# Patient Record
Sex: Female | Born: 1975 | Race: White | Hispanic: No | Marital: Married | Smoking: Never smoker
Health system: Southern US, Community
[De-identification: ages and names within clinical notes are randomized; demographics above are authoritative.]

## PROBLEM LIST (undated history)

## (undated) HISTORY — PX: CHOLECYSTECTOMY: SHX55

## (undated) HISTORY — PX: TONSILLECTOMY: SUR1361

## (undated) HISTORY — PX: NASAL SINUS SURGERY: SHX719

---

## 2011-02-23 ENCOUNTER — Emergency Department (INDEPENDENT_AMBULATORY_CARE_PROVIDER_SITE_OTHER): Payer: BC Managed Care – PPO

## 2011-02-23 ENCOUNTER — Emergency Department (HOSPITAL_BASED_OUTPATIENT_CLINIC_OR_DEPARTMENT_OTHER)
Admission: EM | Admit: 2011-02-23 | Discharge: 2011-02-23 | Disposition: A | Discharge status: Home / Self Care | Payer: BC Managed Care – PPO | Attending: Emergency Medicine | Admitting: Emergency Medicine

## 2011-02-23 DIAGNOSIS — S80812A Abrasion, left lower leg, initial encounter: Secondary | ICD-10-CM

## 2011-02-23 DIAGNOSIS — S8012XA Contusion of left lower leg, initial encounter: Secondary | ICD-10-CM

## 2011-02-23 DIAGNOSIS — M79609 Pain in unspecified limb: Secondary | ICD-10-CM

## 2011-02-23 DIAGNOSIS — S8010XA Contusion of unspecified lower leg, initial encounter: Secondary | ICD-10-CM | POA: Insufficient documentation

## 2011-02-23 DIAGNOSIS — Z79899 Other long term (current) drug therapy: Secondary | ICD-10-CM | POA: Insufficient documentation

## 2011-02-23 DIAGNOSIS — W1789XA Other fall from one level to another, initial encounter: Secondary | ICD-10-CM | POA: Insufficient documentation

## 2011-02-23 DIAGNOSIS — Y92009 Unspecified place in unspecified non-institutional (private) residence as the place of occurrence of the external cause: Secondary | ICD-10-CM | POA: Insufficient documentation

## 2011-02-23 DIAGNOSIS — X58XXXA Exposure to other specified factors, initial encounter: Secondary | ICD-10-CM

## 2011-02-23 DIAGNOSIS — IMO0002 Reserved for concepts with insufficient information to code with codable children: Secondary | ICD-10-CM | POA: Insufficient documentation

## 2011-02-23 MED ORDER — HYDROCODONE-ACETAMINOPHEN 5-325 MG PO TABS: 1.0000 | ORAL_TABLET | Freq: Four times a day (QID) | ORAL | Status: AC | PRN

## 2011-02-23 MED ORDER — IBUPROFEN 800 MG PO TABS: 800.0000 mg | ORAL_TABLET | Freq: Three times a day (TID) | ORAL | Status: AC | PRN

## 2011-02-23 NOTE — ED Provider Notes (Signed)
History     CSN: [id number] Arrival date & time: 02/23/2011  9:16 PM   First MD Initiated Contact with Patient 02/23/11 2146      Chief Complaint  Patient presents with  . Leg Injury    (Consider location/radiation/quality/duration/timing/severity/associated sxs/prior treatment) HPI Patient states, that she was working in her attic when she stepped off the floor and in the attic and stepped through the ceiling.  She states that she has an abrasion to her left lower leg.  She denies any other areas of trauma .she states that her tetanus shot is up-to-date .patient also noted bruising around the area. History reviewed. No pertinent past medical history.  Past Surgical History  Procedure Date  . Cholecystectomy   . Tonsillectomy   . Cesarean section     No family history on file.  History  Substance Use Topics  . Smoking status: Never Smoker   . Smokeless tobacco: Not on file  . Alcohol Use: No    OB History    Grav Para Term Preterm Abortions TAB SAB Ect Mult Living                  Review of Systems Pertinent positive and negatives reviewed in the history of present illness Allergies  Codeine and Percocet  Home Medications   Current Outpatient Rx  Name Route Sig Dispense Refill  . CETIRIZINE HCL 10 MG PO TABS Oral Take 10 mg by mouth daily.      Marland Kitchen VITAMIN D 1000 UNITS PO CAPS Oral Take 1,000 Units by mouth daily.      Marland Kitchen FLUTICASONE PROPIONATE 50 MCG/ACT NA SUSP Nasal Place 1 spray into the nose daily.      . IBUPROFEN 200 MG PO TABS Oral Take 600 mg by mouth once.      Marland Kitchen LAMICTAL PO Oral Take 1 tablet by mouth daily.      . TRILEPTAL PO Oral Take 3 tablets by mouth at bedtime.        BP 103/63  Pulse 80  Temp(Src) 98 F (36.7 C) (Oral)  Resp 16  Ht 5\' 2"  (1.575 m)  Wt 128 lb (58.06 kg)  BMI 23.41 kg/m2  SpO2 100%  Physical Exam  Constitutional: She appears well-developed and well-nourished.  Cardiovascular: Normal rate, regular rhythm and normal  heart sounds.   Pulmonary/Chest: Effort normal and breath sounds normal.  Musculoskeletal:       Right knee: She exhibits ecchymosis.       Legs: Skin: Skin is warm and dry.    ED Course  Procedures (including critical care time)  Labs Reviewed - No data to display Dg Tibia/fibula Left  02/23/2011  *RADIOLOGY REPORT*  Clinical Data: Pain left lower leg, injury  LEFT TIBIA AND FIBULA - 2 VIEW  Comparison: None  Findings: Bone mineralization normal. Joint spaces preserved. No fracture, dislocation, or bone destruction.  IMPRESSION: Normal exam.  Original Report Authenticated By: Lollie Marrow, M.D.    A chest contusion and abrasion to the area of the proximal lower left leg anteriorly.  Patient is advised to ice and elevate the area     MDM          Carlyle Dolly, [place] 02/23/11 2156

## 2011-02-23 NOTE — ED Notes (Signed)
Stepped thru attic/ceiling-pain to left lower leg-abrasion noted

## 2011-02-24 NOTE — ED Provider Notes (Signed)
Medical screening examination/treatment/procedure(s) were performed by non-physician practitioner and as supervising physician I was immediately available for consultation/collaboration.   Forbes Cellar, MD 02/24/11 [phone]

## 2011-11-03 ENCOUNTER — Encounter (HOSPITAL_BASED_OUTPATIENT_CLINIC_OR_DEPARTMENT_OTHER): Payer: Self-pay | Admitting: *Deleted

## 2011-11-03 ENCOUNTER — Emergency Department (HOSPITAL_BASED_OUTPATIENT_CLINIC_OR_DEPARTMENT_OTHER)
Admission: EM | Admit: 2011-11-03 | Discharge: 2011-11-03 | Disposition: A | Discharge status: Home / Self Care | Payer: BC Managed Care – PPO | Attending: Emergency Medicine | Admitting: Emergency Medicine

## 2011-11-03 ENCOUNTER — Emergency Department (HOSPITAL_BASED_OUTPATIENT_CLINIC_OR_DEPARTMENT_OTHER): Payer: BC Managed Care – PPO

## 2011-11-03 DIAGNOSIS — R197 Diarrhea, unspecified: Secondary | ICD-10-CM | POA: Insufficient documentation

## 2011-11-03 DIAGNOSIS — R109 Unspecified abdominal pain: Secondary | ICD-10-CM | POA: Insufficient documentation

## 2011-11-03 DIAGNOSIS — R112 Nausea with vomiting, unspecified: Secondary | ICD-10-CM | POA: Insufficient documentation

## 2011-11-03 DIAGNOSIS — Z79899 Other long term (current) drug therapy: Secondary | ICD-10-CM | POA: Insufficient documentation

## 2011-11-03 DIAGNOSIS — R1110 Vomiting, unspecified: Secondary | ICD-10-CM

## 2011-11-03 LAB — CBC WITH DIFFERENTIAL/PLATELET
Eosinophils Absolute: 0 10*3/uL (ref 0.0–0.7)
Lymphocytes Relative: 25 % (ref 12–46)
Lymphs Abs: 2.4 10*3/uL (ref 0.7–4.0)
Neutrophils Relative %: 67 % (ref 43–77)
Platelets: 226 10*3/uL (ref 150–400)
RBC: 4.38 MIL/uL (ref 3.87–5.11)
WBC: 9.6 10*3/uL (ref 4.0–10.5)

## 2011-11-03 LAB — COMPREHENSIVE METABOLIC PANEL WITH GFR
ALT: 17 U/L (ref 0–35)
AST: 23 U/L (ref 0–37)
Albumin: 4.6 g/dL (ref 3.5–5.2)
Alkaline Phosphatase: 62 U/L (ref 39–117)
BUN: 10 mg/dL (ref 6–23)
CO2: 26 meq/L (ref 19–32)
Calcium: 9.8 mg/dL (ref 8.4–10.5)
Chloride: 100 meq/L (ref 96–112)
Creatinine, Ser: 0.7 mg/dL (ref 0.50–1.10)
GFR calc Af Amer: >90 mL/min
GFR calc non Af Amer: >90 mL/min
Glucose, Bld: 96 mg/dL (ref 70–99)
Potassium: 3.6 meq/L (ref 3.5–5.1)
Sodium: 138 meq/L (ref 135–145)
Total Bilirubin: 0.4 mg/dL (ref 0.3–1.2)
Total Protein: 7.6 g/dL (ref 6.0–8.3)

## 2011-11-03 LAB — URINALYSIS, ROUTINE W REFLEX MICROSCOPIC
Bilirubin Urine: NEGATIVE
Glucose, UA: NEGATIVE mg/dL
Hgb urine dipstick: NEGATIVE
Ketones, ur: 15 mg/dL — AB
Leukocytes, UA: NEGATIVE
Nitrite: NEGATIVE
Protein, ur: NEGATIVE mg/dL
Specific Gravity, Urine: 1.005 (ref 1.005–1.030)
Urobilinogen, UA: 0.2 mg/dL (ref 0.0–1.0)
pH: 6 (ref 5.0–8.0)

## 2011-11-03 LAB — LIPASE, BLOOD: Lipase: 39 U/L (ref 11–59)

## 2011-11-03 MED ORDER — IOHEXOL 300 MG/ML  SOLN
20.0000 mL | INTRAMUSCULAR | Status: AC
  Administered 2011-11-03 (×2): 20 mL via ORAL

## 2011-11-03 MED ORDER — IOHEXOL 300 MG/ML  SOLN
100.0000 mL | Freq: Once | INTRAMUSCULAR | Status: AC | PRN
  Administered 2011-11-03: 100 mL via INTRAVENOUS

## 2011-11-03 MED ORDER — ONDANSETRON 4 MG PO TBDP: 4.0000 mg | ORAL_TABLET | Freq: Three times a day (TID) | ORAL | Status: AC | PRN

## 2011-11-03 MED ORDER — ONDANSETRON HCL 4 MG/2ML IJ SOLN
4.0000 mg | Freq: Once | INTRAMUSCULAR | Status: AC
  Administered 2011-11-03: 4 mg via INTRAVENOUS
  Filled 2011-11-03: qty 2

## 2011-11-03 MED ORDER — SODIUM CHLORIDE 0.9 % IV BOLUS (SEPSIS)
1000.0000 mL | Freq: Once | INTRAVENOUS | Status: AC
  Administered 2011-11-03: 1000 mL via INTRAVENOUS

## 2011-11-03 MED ORDER — HYDROCODONE-ACETAMINOPHEN 5-500 MG PO TABS: 1.0000 | ORAL_TABLET | Freq: Four times a day (QID) | ORAL | Status: AC | PRN

## 2011-11-03 NOTE — ED Provider Notes (Signed)
Medical screening examination/treatment/procedure(s) were performed by non-physician practitioner and as supervising physician I was immediately available for consultation/collaboration.   Talal Fritchman III, MD 11/03/11 1916 

## 2011-11-03 NOTE — ED Provider Notes (Signed)
History     CSN: [id number]  Arrival date & time 11/03/11  1421   First MD Initiated Contact with Patient 11/03/11 1422      Chief Complaint  Patient presents with  . Emesis    (Consider location/radiation/quality/duration/timing/severity/associated sxs/prior treatment) HPI Comments: Pt states that she has been having intermittent pain and diarrhea over the last week:pt states that she had vomiting yesterday:pt denies fever;pt was seen at urgent care and sent to the er for scan for diverticulitis  Patient is a 36 y.o. female presenting with abdominal pain. The history is provided by the patient. No language interpreter was used.  Abdominal Pain The primary symptoms of the illness include abdominal pain, nausea, vomiting and diarrhea. The primary symptoms of the illness do not include fever, dysuria or vaginal discharge. The current episode started more than 2 days ago. The onset of the illness was gradual. The problem has not changed since onset. The patient states that she believes she is currently not pregnant.    History reviewed. No pertinent past medical history.  Past Surgical History  Procedure Date  . Cholecystectomy   . Tonsillectomy   . Cesarean section     No family history on file.  History  Substance Use Topics  . Smoking status: Never Smoker   . Smokeless tobacco: Not on file  . Alcohol Use: No    OB History    Grav Para Term Preterm Abortions TAB SAB Ect Mult Living                  Review of Systems  Constitutional: Negative for fever.  Respiratory: Negative.   Cardiovascular: Negative.   Gastrointestinal: Positive for nausea, vomiting, abdominal pain and diarrhea.  Genitourinary: Negative for dysuria and vaginal discharge.    Allergies  Biaxin; Codeine; and Percocet  Home Medications   Current Outpatient Rx  Name Route Sig Dispense Refill  . CETIRIZINE HCL 10 MG PO TABS Oral Take 10 mg by mouth daily.      Marland Kitchen VITAMIN D 1000 UNITS PO CAPS  Oral Take 1,000 Units by mouth daily.      Marland Kitchen FLUTICASONE PROPIONATE 50 MCG/ACT NA SUSP Nasal Place 1 spray into the nose daily.      . IBUPROFEN 200 MG PO TABS Oral Take 600 mg by mouth once.      Marland Kitchen LAMICTAL PO Oral Take 1 tablet by mouth daily.      . TRILEPTAL PO Oral Take 3 tablets by mouth at bedtime.        BP 114/71  Pulse 86  Temp 98 F (36.7 C) (Oral)  Resp 16  Ht 5\' 2"  (1.575 m)  Wt 127 lb (57.607 kg)  BMI 23.23 kg/m2  SpO2 99%  Physical Exam  Nursing note and vitals reviewed. Constitutional: She is oriented to person, place, and time. She appears well-developed and well-nourished.  HENT:  Head: Normocephalic and atraumatic.  Eyes: Conjunctivae and EOM are normal.  Neck: Neck supple.  Cardiovascular: Normal rate and regular rhythm.   Pulmonary/Chest: Effort normal and breath sounds normal.  Abdominal: Soft. There is tenderness in the left upper quadrant and left lower quadrant.  Musculoskeletal: Normal range of motion.  Neurological: She is alert and oriented to person, place, and time.  Skin: Skin is warm and dry.    ED Course  Procedures (including critical care time)  Labs Reviewed  URINALYSIS, ROUTINE W REFLEX MICROSCOPIC - Abnormal; Notable for the following:    Ketones, ur  15 (*)     All other components within normal limits  CBC WITH DIFFERENTIAL - Abnormal; Notable for the following:    RDW 11.2 (*)     All other components within normal limits  PREGNANCY, URINE  COMPREHENSIVE METABOLIC PANEL  LIPASE, BLOOD   Ct Abdomen Pelvis W Contrast  11/03/2011  *RADIOLOGY REPORT*  Clinical Data: 36 year old female with abdominal and pelvic pain and vomiting.  CT ABDOMEN AND PELVIS WITH CONTRAST  Technique:  Multidetector CT imaging of the abdomen and pelvis was performed following the standard protocol during bolus administration of intravenous contrast.  Contrast: 100 ml intravenous Omnipaque-300  Comparison: None  Findings: The liver, spleen, adrenal glands,  pancreas and kidneys are unremarkable. The patient is status post cholecystectomy.  No free fluid, enlarged lymph nodes, biliary dilation or abdominal aortic aneurysm identified.  The bowel and bladder are unremarkable.  There is no evidence of bowel obstruction or pneumoperitoneum. An IUD is present. There is no evidence of mass or focal collection/abscess.  No acute or suspicious bony abnormalities are identified.  IMPRESSION: No evidence of acute abnormality.  IUD.  Original Report Authenticated By: Rosendo Gros, M.D.     1. Vomiting   2. Abdominal pain       MDM  No acute process noted on ct scan:pt tolerating po without any problem:pt is okay to follow up as needed:will treat symptomatically        Teressa Lower, NP 11/03/11 1722

## 2011-11-03 NOTE — ED Notes (Signed)
Stomach cramps w/ N/V/D that started a week ago. Went to the UC and was sent here for "possible diverticulitis"

## 2013-11-06 ENCOUNTER — Encounter (HOSPITAL_BASED_OUTPATIENT_CLINIC_OR_DEPARTMENT_OTHER): Payer: Self-pay | Admitting: Emergency Medicine

## 2013-11-06 ENCOUNTER — Emergency Department (HOSPITAL_BASED_OUTPATIENT_CLINIC_OR_DEPARTMENT_OTHER)
Admission: EM | Admit: 2013-11-06 | Discharge: 2013-11-06 | Disposition: A | Discharge status: Home / Self Care | Payer: BC Managed Care – PPO | Attending: Emergency Medicine | Admitting: Emergency Medicine

## 2013-11-06 ENCOUNTER — Emergency Department (HOSPITAL_BASED_OUTPATIENT_CLINIC_OR_DEPARTMENT_OTHER): Payer: BC Managed Care – PPO

## 2013-11-06 DIAGNOSIS — Z79899 Other long term (current) drug therapy: Secondary | ICD-10-CM | POA: Insufficient documentation

## 2013-11-06 DIAGNOSIS — M79609 Pain in unspecified limb: Secondary | ICD-10-CM | POA: Insufficient documentation

## 2013-11-06 DIAGNOSIS — I82401 Acute embolism and thrombosis of unspecified deep veins of right lower extremity: Secondary | ICD-10-CM

## 2013-11-06 DIAGNOSIS — I82409 Acute embolism and thrombosis of unspecified deep veins of unspecified lower extremity: Secondary | ICD-10-CM | POA: Insufficient documentation

## 2013-11-06 MED ORDER — RIVAROXABAN 15 MG PO TABS
ORAL_TABLET | ORAL | Status: AC
  Filled 2013-11-06: qty 1

## 2013-11-06 MED ORDER — RIVAROXABAN 15 MG PO TABS
15.0000 mg | ORAL_TABLET | Freq: Once | ORAL | Status: AC
Start: 2013-11-06 — End: 2013-11-06
  Administered 2013-11-06: 15 mg via ORAL

## 2013-11-06 MED ORDER — XARELTO VTE STARTER PACK 15 & 20 MG PO TBPK: 15.0000 mg | ORAL_TABLET | ORAL | Status: AC

## 2013-11-06 MED ORDER — HYDROCODONE-ACETAMINOPHEN 5-325 MG PO TABS: 1.0000 | ORAL_TABLET | ORAL | Status: DC | PRN

## 2013-11-06 NOTE — ED Notes (Signed)
Patient transported to Ultrasound 

## 2013-11-06 NOTE — ED Notes (Signed)
Pt sent from PCP for pain to right LE-recent flight travel

## 2013-11-06 NOTE — Discharge Instructions (Signed)

## 2013-11-06 NOTE — ED Provider Notes (Signed)
CSN: [id number]     Arrival date & time 11/06/13  1748 History   First MD Initiated Contact with Patient 11/06/13 1818     Chief Complaint  Patient presents with  . Leg Pain     (Consider location/radiation/quality/duration/timing/severity/associated sxs/prior Treatment) Patient is a 38 y.o. female presenting with leg pain. The history is provided by the patient. No language interpreter was used.  Leg Pain Location:  Leg Time since incident:  3 days Pain details:    Quality:  Aching Associated symptoms: no fever   Associated symptoms comment:  Pain in lower right leg medially for the past couple of days. No known injury. She reports traveling quite a bit in the last 2-4 weeks, the last was one week ago, traveling by air to [place]. No chest pain or SOB. No history of DVT or clots.    History reviewed. No pertinent past medical history. Past Surgical History  Procedure Laterality Date  . Cholecystectomy    . Tonsillectomy    . Cesarean section    . Nasal sinus surgery     No family history on file. History  Substance Use Topics  . Smoking status: Never Smoker   . Smokeless tobacco: Not on file  . Alcohol Use: No   OB History   Grav Para Term Preterm Abortions TAB SAB Ect Mult Living                 Review of Systems  Constitutional: Negative for fever.  Respiratory: Negative for shortness of breath.   Cardiovascular: Negative for chest pain.  Musculoskeletal:       See HPI.  Skin: Positive for color change. Negative for wound.      Allergies  Biaxin; Codeine; and Percocet  Home Medications   Prior to Admission medications   Medication Sig Start Date End Date Taking? Authorizing Provider  Thiamine HCl (VITAMIN B-1 PO) Take by mouth.   Yes Historical Provider, MD  cetirizine (ZYRTEC) 10 MG tablet Take 10 mg by mouth daily.     Historical Provider, MD  Cholecalciferol (VITAMIN D) 1000 UNITS capsule Take 1,000 Units by mouth daily.     Historical Provider, MD   ciclesonide (OMNARIS) 50 MCG/ACT nasal spray Place 2 sprays into both nostrils daily.    Historical Provider, MD  ibuprofen (ADVIL,MOTRIN) 200 MG tablet Take 600 mg by mouth once. For headache.    Historical Provider, MD  lamoTRIgine (LAMICTAL) 200 MG tablet Take 200 mg by mouth daily. Anti-seizure medication.  Consult needed.    Historical Provider, MD  Oxcarbazepine (TRILEPTAL) 300 MG tablet Take 300 mg by mouth 2 (two) times daily.    Historical Provider, MD  triamcinolone ointment (KENALOG) 0.1 % Apply 1 application topically 2 (two) times daily.    Historical Provider, MD   BP 110/60  Pulse 73  Temp(Src) 98.3 F (36.8 C) (Oral)  Resp 22  Ht 5\' 1"  (1.549 m)  Wt 125 lb (56.7 kg)  BMI 23.63 kg/m2  SpO2 100% Physical Exam  Constitutional: She is oriented to person, place, and time. She appears well-developed and well-nourished. No distress.  Cardiovascular: Intact distal pulses.   Musculoskeletal:  Right lower leg tender of medial distal leg. There is a palpable, hard area measuring approximately 3 cm x 4 cm that is exquisitely tender and in mildly erythematous. No fluctuance. No calf or thigh tenderness.   Neurological: She is alert and oriented to person, place, and time.    ED Course  Procedures (  including critical care time) Labs Review Labs Reviewed - No data to display  Imaging Review [place] Venous Img Lower Unilateral Right  11/06/2013   CLINICAL DATA:  RIGHT lower extremity calf pain after along flight 3 days ago. Swelling and redness. Edema.  EXAM: RIGHT LOWER EXTREMITY VENOUS DOPPLER ULTRASOUND  TECHNIQUE: Gray-scale sonography with graded compression, as well as color Doppler and duplex ultrasound were performed to evaluate the lower extremity deep venous systems from the level of the common femoral vein and including the common femoral, femoral, profunda femoral, popliteal and calf veins including the posterior tibial, peroneal and gastrocnemius veins when visible. The  superficial great saphenous vein was also interrogated. Spectral Doppler was utilized to evaluate flow at rest and with distal augmentation maneuvers in the common femoral, femoral and popliteal veins.  COMPARISON:  None.  FINDINGS: Common Femoral Vein: No evidence of thrombus. Normal compressibility, respiratory phasicity and response to augmentation.  Saphenofemoral Junction: No evidence of thrombus. Normal compressibility and flow on color Doppler imaging.  Profunda Femoral Vein: No evidence of thrombus. Normal compressibility and flow on color Doppler imaging.  Femoral Vein: No evidence of thrombus. Normal compressibility, respiratory phasicity and response to augmentation.  Popliteal Vein: No evidence of thrombus. Normal compressibility, respiratory phasicity and response to augmentation.  Calf Veins: There is thrombus within the posterior tibial veins, peroneal veins, and tibioperoneal trunk. There is also thrombosed varicosity medial calf.  Superficial Great Saphenous Vein: No evidence of thrombus. Normal compressibility and flow on color Doppler imaging.  Venous Reflux:  None.  Other Findings:  None.  IMPRESSION: Deep venous thrombosis involving the RIGHT lower extremity calf veins and a superficial calf varicosity.   Electronically Signed   By: Davonna BellingJohn  Curnes M.D.   On: 11/06/2013 18:51     EKG Interpretation None      MDM   Final diagnoses:  None    1. DVT, right leg, not involving thigh  Pain medication offered but patient declined. She is started on Xarelto in the ED in treatment of DVT. Encouraged to follow up with PCP within the next 2 weeks for recheck and discussion of duration of anticoagulation treatment.     Arnoldo HookerShari A Virgel Haro, PA-C 11/06/13 1946

## 2013-11-07 NOTE — ED Provider Notes (Signed)
Medical screening examination/treatment/procedure(s) were performed by non-physician practitioner and as supervising physician I was immediately available for consultation/collaboration.   EKG Interpretation None        Lashone Stauber T Gregrey Bloyd, MD 11/07/13 0006 

## 2014-10-12 ENCOUNTER — Other Ambulatory Visit: Payer: Self-pay | Admitting: Orthopaedic Surgery

## 2014-10-12 DIAGNOSIS — M5412 Radiculopathy, cervical region: Secondary | ICD-10-CM

## 2014-10-22 ENCOUNTER — Ambulatory Visit
Admission: RE | Admit: 2014-10-22 | Discharge: 2014-10-22 | Disposition: A | Discharge status: Home / Self Care | Payer: BC Managed Care – PPO | Source: Ambulatory Visit | Attending: Orthopaedic Surgery | Admitting: Orthopaedic Surgery

## 2014-10-22 DIAGNOSIS — M5412 Radiculopathy, cervical region: Secondary | ICD-10-CM

## 2014-12-08 DIAGNOSIS — G479 Sleep disorder, unspecified: Secondary | ICD-10-CM

## 2014-12-08 DIAGNOSIS — R5383 Other fatigue: Secondary | ICD-10-CM | POA: Insufficient documentation

## 2014-12-08 DIAGNOSIS — Z8719 Personal history of other diseases of the digestive system: Secondary | ICD-10-CM | POA: Insufficient documentation

## 2014-12-08 DIAGNOSIS — Z8709 Personal history of other diseases of the respiratory system: Secondary | ICD-10-CM | POA: Insufficient documentation

## 2014-12-08 DIAGNOSIS — H1010 Acute atopic conjunctivitis, unspecified eye: Secondary | ICD-10-CM | POA: Insufficient documentation

## 2014-12-08 DIAGNOSIS — J309 Allergic rhinitis, unspecified: Secondary | ICD-10-CM | POA: Insufficient documentation

## 2015-06-13 DIAGNOSIS — J3089 Other allergic rhinitis: Secondary | ICD-10-CM | POA: Diagnosis not present

## 2015-06-14 DIAGNOSIS — J301 Allergic rhinitis due to pollen: Secondary | ICD-10-CM | POA: Diagnosis not present

## 2015-06-24 ENCOUNTER — Ambulatory Visit (INDEPENDENT_AMBULATORY_CARE_PROVIDER_SITE_OTHER): Payer: BC Managed Care – PPO

## 2015-06-24 DIAGNOSIS — H1010 Acute atopic conjunctivitis, unspecified eye: Secondary | ICD-10-CM | POA: Diagnosis not present

## 2015-06-24 DIAGNOSIS — J309 Allergic rhinitis, unspecified: Secondary | ICD-10-CM | POA: Diagnosis not present

## 2015-06-24 DIAGNOSIS — J302 Other seasonal allergic rhinitis: Principal | ICD-10-CM

## 2015-06-24 DIAGNOSIS — J3089 Other allergic rhinitis: Principal | ICD-10-CM

## 2015-06-24 NOTE — Progress Notes (Signed)
Immunotherapy   Patient Details  Name: Allison Moralesmy Collister MRN: [id number] Date of Birth: May 06, 1975  06/24/2015  Lanai Lisanti  Pt picked up vials 1- GRASS, 1-TREE. No problems with last vials. Injections given by pts father-in-law who is an EMT. Following schedule: C  Frequency: build up every 2 weeks, then at .50 every 3 weeks. Epi-Pen: yes Consent signed and patient instructions given.   Tylene Quashie Spainhour 06/24/2015, 3:54 PM

## 2016-01-17 DIAGNOSIS — J301 Allergic rhinitis due to pollen: Secondary | ICD-10-CM | POA: Diagnosis not present

## 2016-01-18 DIAGNOSIS — J302 Other seasonal allergic rhinitis: Secondary | ICD-10-CM | POA: Diagnosis not present

## 2016-02-16 ENCOUNTER — Ambulatory Visit: Payer: Self-pay | Admitting: *Deleted

## 2016-02-16 DIAGNOSIS — J309 Allergic rhinitis, unspecified: Secondary | ICD-10-CM

## 2016-02-16 NOTE — Progress Notes (Signed)
Pt picked up vials 1- GRASS, 1-TREE. No problems with last vials. Injections given by pts father-in-law who is an EMT. Following schedule: C  Frequency: build up every 2 weeks, then at .50 every 3 weeks.
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
## 2016-05-28 NOTE — Addendum Note (Signed)
Addended by: Berna BueWHITAKER, CARRIE L on: 05/28/2016 11:56 AM   Modules accepted: Orders

## 2016-08-07 NOTE — Progress Notes (Signed)
Vials to be made 08-07-16.  jm 

## 2016-08-08 DIAGNOSIS — J301 Allergic rhinitis due to pollen: Secondary | ICD-10-CM | POA: Diagnosis not present

## 2016-08-23 ENCOUNTER — Ambulatory Visit: Payer: BC Managed Care – PPO

## 2016-09-03 ENCOUNTER — Ambulatory Visit (INDEPENDENT_AMBULATORY_CARE_PROVIDER_SITE_OTHER): Payer: BC Managed Care – PPO | Admitting: *Deleted

## 2016-09-03 ENCOUNTER — Ambulatory Visit: Payer: BC Managed Care – PPO

## 2016-09-03 DIAGNOSIS — H1010 Acute atopic conjunctivitis, unspecified eye: Secondary | ICD-10-CM

## 2016-09-03 DIAGNOSIS — J302 Other seasonal allergic rhinitis: Secondary | ICD-10-CM

## 2016-09-03 DIAGNOSIS — J3089 Other allergic rhinitis: Secondary | ICD-10-CM | POA: Diagnosis not present

## 2016-09-03 NOTE — Progress Notes (Signed)
Patient picked up Red 1/100 vials Tree and Ragweed given .10 dosage of each and reviewed Sch c buildup every 2 weeks then .50 every 3 weeks. Injs. Administered by father in law EMT per Dr Earney Hamburgkolzow

## 2017-02-27 DIAGNOSIS — J301 Allergic rhinitis due to pollen: Secondary | ICD-10-CM | POA: Diagnosis not present

## 2017-02-27 NOTE — Progress Notes (Signed)
VIAL EXP 02/27/18 

## 2017-02-28 NOTE — Progress Notes (Signed)
RELABEL VIAL

## 2017-03-14 ENCOUNTER — Ambulatory Visit (INDEPENDENT_AMBULATORY_CARE_PROVIDER_SITE_OTHER): Payer: BC Managed Care – PPO | Admitting: *Deleted

## 2017-03-14 DIAGNOSIS — J309 Allergic rhinitis, unspecified: Secondary | ICD-10-CM | POA: Diagnosis not present

## 2017-03-14 NOTE — Progress Notes (Signed)
Patient picked up vials to take out of the office. Received 0.10 in each. Will change schedule to build up every 2 and every 4 weeks at 0.50.

## 2017-10-09 ENCOUNTER — Encounter: Payer: Self-pay | Admitting: *Deleted

## 2017-10-09 DIAGNOSIS — J301 Allergic rhinitis due to pollen: Secondary | ICD-10-CM | POA: Diagnosis not present

## 2017-10-09 NOTE — Progress Notes (Signed)
Maintenance vial. Exp: 10-10-18. hv

## 2017-10-21 ENCOUNTER — Ambulatory Visit: Payer: BC Managed Care – PPO | Admitting: *Deleted

## 2017-10-21 DIAGNOSIS — J309 Allergic rhinitis, unspecified: Secondary | ICD-10-CM

## 2017-10-21 DIAGNOSIS — Z975 Presence of (intrauterine) contraceptive device: Secondary | ICD-10-CM | POA: Diagnosis not present

## 2017-10-21 DIAGNOSIS — Z86718 Personal history of other venous thrombosis and embolism: Secondary | ICD-10-CM | POA: Diagnosis not present

## 2017-10-21 DIAGNOSIS — Z7901 Long term (current) use of anticoagulants: Secondary | ICD-10-CM | POA: Diagnosis not present

## 2017-10-21 DIAGNOSIS — I80221 Phlebitis and thrombophlebitis of right popliteal vein: Secondary | ICD-10-CM | POA: Diagnosis not present

## 2017-10-21 NOTE — Progress Notes (Signed)
Tiffine picked up her vials to take out of office and will continue to build up every 2 weeks and receive 0.50 every 4 weeks.

## 2017-10-28 DIAGNOSIS — Z7901 Long term (current) use of anticoagulants: Secondary | ICD-10-CM | POA: Diagnosis not present

## 2017-10-28 DIAGNOSIS — Z86718 Personal history of other venous thrombosis and embolism: Secondary | ICD-10-CM

## 2017-10-28 DIAGNOSIS — Z975 Presence of (intrauterine) contraceptive device: Secondary | ICD-10-CM

## 2017-10-28 DIAGNOSIS — I80221 Phlebitis and thrombophlebitis of right popliteal vein: Secondary | ICD-10-CM

## 2018-01-02 DIAGNOSIS — I82551 Chronic embolism and thrombosis of right peroneal vein: Secondary | ICD-10-CM | POA: Diagnosis not present

## 2018-01-02 DIAGNOSIS — Z86718 Personal history of other venous thrombosis and embolism: Secondary | ICD-10-CM | POA: Diagnosis not present

## 2018-01-02 DIAGNOSIS — Z7901 Long term (current) use of anticoagulants: Secondary | ICD-10-CM | POA: Diagnosis not present

## 2018-01-02 DIAGNOSIS — Z975 Presence of (intrauterine) contraceptive device: Secondary | ICD-10-CM | POA: Diagnosis not present

## 2018-01-06 ENCOUNTER — Ambulatory Visit: Payer: BC Managed Care – PPO | Admitting: Allergy and Immunology

## 2018-01-13 ENCOUNTER — Ambulatory Visit: Payer: BC Managed Care – PPO | Admitting: Allergy and Immunology

## 2018-01-29 ENCOUNTER — Encounter: Payer: Self-pay | Admitting: Allergy and Immunology

## 2018-01-29 ENCOUNTER — Ambulatory Visit (INDEPENDENT_AMBULATORY_CARE_PROVIDER_SITE_OTHER): Payer: BC Managed Care – PPO | Admitting: Allergy and Immunology

## 2018-01-29 VITALS — BP 120/76 | HR 78 | Temp 98.4°F | Resp 16 | Ht 61.75 in | Wt 136.0 lb

## 2018-01-29 DIAGNOSIS — J3089 Other allergic rhinitis: Secondary | ICD-10-CM

## 2018-01-29 DIAGNOSIS — J301 Allergic rhinitis due to pollen: Secondary | ICD-10-CM

## 2018-01-29 NOTE — Progress Notes (Signed)
Follow-up Note  Referring Provider: Irena Reichmann, DO Primary Provider: Leane Call, PA-C Date of Office Visit: 01/29/2018  Subjective:   Allison Orozco (DOB: Dec 17, 1975) is a 42 y.o. female [facility] in re-evaluation of the following:  HPI: Allison Orozco returns to this clinic in reevaluation of her allergic rhinoconjunctivitis and history of recurrent sinusitis.  I have not seen her in this clinic in years.  She has undergone a 5-year course of immunotherapy directed against various pollens.  She has been using a 4-week administration and she discontinued this form of therapy in September 2019.  While utilizing Singulair and Allegra and Nasacort she has actually done relatively well and only appears to have sinus infections usually one during the spring and one during the fall that responds appropriately to antibiotics.  Otherwise, in between these episodes she does very well and has very little upper airway or eye issues.  Allergies as of 01/29/2018      Reactions   Biaxin [clarithromycin]    Codeine Nausea And Vomiting   Percocet [oxycodone-acetaminophen] Itching      Medication List      cetirizine 10 MG tablet Commonly known as:  ZYRTEC Take 10 mg by mouth daily.   EPIPEN 2-PAK 0.3 mg/0.3 mL Soaj injection Generic drug:  EPINEPHrine Inject 0.3 mg into the muscle once.   ibuprofen 200 MG tablet Commonly known as:  ADVIL,MOTRIN Take 600 mg by mouth once. For headache.   lamoTRIgine 200 MG tablet Commonly known as:  LAMICTAL Take 200 mg by mouth daily. Anti-seizure medication.  Consult needed.   lansoprazole 30 MG capsule Commonly known as:  PREVACID Take 30 mg by mouth daily at 12 noon.   NASACORT ALLERGY 24HR NA Place into the nose.   pramipexole 0.125 MG tablet Commonly known as:  MIRAPEX Take by mouth 3 (three) times daily.   Vitamin D 1000 units capsule Take 1,000 Units by mouth daily.   XARELTO STARTER PACK  15 & 20 MG Tbpk Generic drug:  Rivaroxaban Take 15-20 mg by mouth as directed. Take as directed on package: Start with one 15mg  tablet by mouth twice a day with food. On Day 22, switch to one 20mg  tablet once a day with food.       History reviewed. No pertinent past medical history.  Past Surgical History:  Procedure Laterality Date  . CESAREAN SECTION    . CHOLECYSTECTOMY    . NASAL SINUS SURGERY    . TONSILLECTOMY      Review of systems negative except as noted in HPI / PMHx or noted below:  Review of Systems  Constitutional: Negative.   HENT: Negative.   Eyes: Negative.   Respiratory: Negative.   Cardiovascular: Negative.   Gastrointestinal: Negative.   Genitourinary: Negative.   Musculoskeletal: Negative.   Skin: Negative.   Neurological: Negative.   Endo/Heme/Allergies: Negative.   Psychiatric/Behavioral: Negative.      Objective:   Vitals:   01/29/18 1433  BP: 120/76  Pulse: 78  Resp: 16  Temp: 98.4 F (36.9 C)   Height: 5' 1.75" (156.8 cm)  Weight: 136 lb (61.7 kg)   Physical Exam  HENT:  Head: Normocephalic.  Right Ear: Tympanic membrane, external ear and ear canal normal.  Left Ear: Tympanic membrane, external ear and ear canal normal.  Nose: Nose normal. No mucosal edema or rhinorrhea.  Mouth/Throat: Uvula is midline, oropharynx is clear and moist and mucous membranes are  normal. No oropharyngeal exudate.  Eyes: Conjunctivae are normal.  Neck: Trachea normal. No tracheal tenderness present. No tracheal deviation present. No thyromegaly present.  Cardiovascular: Normal rate, regular rhythm, S1 normal, S2 normal and normal heart sounds.  No murmur heard. Pulmonary/Chest: Breath sounds normal. No stridor. No respiratory distress. She has no wheezes. She has no rales.  Musculoskeletal: She exhibits no edema.  Lymphadenopathy:       Head (right side): No tonsillar adenopathy present.       Head (left side): No tonsillar adenopathy present.    She  has no cervical adenopathy.  Neurological: She is alert.  Skin: No rash noted. She is not diaphoretic. No erythema. Nails show no clubbing.    Diagnostics: Allergy skin testing was performed.  She did not demonstrate any hypersensitivity against a screening panel of aeroallergens.  Assessment and Plan:   1. Perennial allergic rhinitis   2. Seasonal allergic rhinitis due to pollen     1.  Continue montelukast 10 mg daily  2.  Continue Nasacort 1-2 sprays each nostril daily  3.  Use nasal saline and antihistamine if needed  4.  Obtain flu vaccine every year  5.  Return to clinic in 1 year or earlier if problem  [name] appears to be doing quite well and her immunotherapy has altered her immunological hyperreactivity and she did not demonstrate any degree of atopic disease during today's skin testing.  She has the option of continuing on anti-inflammatory agents for her airway as noted above.  I will see her back in this clinic in 1 year or earlier if there is a problem.  Allison Schimke, MD Allergy / Immunology [facility]

## 2018-01-29 NOTE — Patient Instructions (Signed)
  1.  Continue montelukast 10 mg daily  2.  Continue Nasacort 1-2 sprays each nostril daily  3.  Use nasal saline and antihistamine if needed  4.  Obtain flu vaccine every year  5.  Return to clinic in 1 year or earlier if problem

## 2018-01-30 ENCOUNTER — Encounter: Payer: Self-pay | Admitting: Allergy and Immunology

## 2018-02-04 ENCOUNTER — Encounter: Payer: Self-pay | Admitting: *Deleted

## 2018-03-25 DIAGNOSIS — Z86718 Personal history of other venous thrombosis and embolism: Secondary | ICD-10-CM | POA: Diagnosis not present

## 2024-04-22 ENCOUNTER — Ambulatory Visit (HOSPITAL_BASED_OUTPATIENT_CLINIC_OR_DEPARTMENT_OTHER): Admission: EM | Admit: 2024-04-22 | Discharge: 2024-04-22 | Disposition: A | Discharge status: Home / Self Care

## 2024-04-22 ENCOUNTER — Encounter (HOSPITAL_BASED_OUTPATIENT_CLINIC_OR_DEPARTMENT_OTHER): Payer: Self-pay

## 2024-04-22 DIAGNOSIS — J329 Chronic sinusitis, unspecified: Secondary | ICD-10-CM | POA: Diagnosis not present

## 2024-04-22 MED ORDER — CEFDINIR 300 MG PO CAPS: 300.0000 mg | ORAL_CAPSULE | Freq: Two times a day (BID) | ORAL | 0 refills | Status: AC

## 2024-04-22 MED ORDER — TRIAMCINOLONE ACETONIDE 40 MG/ML IJ SUSP
40.0000 mg | Freq: Once | INTRAMUSCULAR | Status: AC
  Administered 2024-04-22: 40 mg via INTRAMUSCULAR

## 2024-04-22 NOTE — Discharge Instructions (Signed)
 Treating you for a possible sinus infection.  Take the medications as prescribed.  Continue with over-the-counter measures as needed.  Follow-up as needed

## 2024-04-22 NOTE — ED Provider Notes (Signed)
 " [facility]    CSN: [id number] Arrival date & time: 04/22/24  1440      History   Chief Complaint Chief Complaint  Patient presents with   Nasal Congestion    HPI Allison Orozco is a 49 y.o. female.   Pt is a 49 year old female that presents with nasal congestion and ear pressure. States onset of ear pressure yesterday afternoon. That has improved. Nasal congestion today. Suffers from chronic sinusitis.  Treated with antibiotic over Christmas. Patient bought a covid/flu test at our pharmacy and has not taken it. Denies fever, body aches, chills. Hx of sinus surgery       History reviewed. No pertinent past medical history.  Patient Active Problem List   Diagnosis Date Noted   Allergic rhinoconjunctivitis 12/08/2014   H/O chronic sinusitis 12/08/2014   H/O gastroesophageal reflux (GERD) 12/08/2014   Fatigue 12/08/2014   Sleep disturbance 12/08/2014    Past Surgical History:  Procedure Laterality Date   CESAREAN SECTION     CHOLECYSTECTOMY     NASAL SINUS SURGERY     TONSILLECTOMY      OB History   No obstetric history on file.      Home Medications    Prior to Admission medications  Medication Sig Start Date End Date Taking? Authorizing Provider  busPIRone (BUSPAR) 15 MG tablet Take 15 mg by mouth 2 (two) times daily. 03/27/24  Yes [provider]  cefdinir  (OMNICEF ) 300 MG capsule Take 1 capsule (300 mg total) by mouth 2 (two) times daily for 7 days. 04/22/24 04/29/24 Yes Dashaun Onstott A, FNP  estradiol (ESTRACE) 0.01 % CREA vaginal cream Place 1 g vaginally daily. 01/16/24  Yes [provider]  fluticasone (FLONASE) 50 MCG/ACT nasal spray Place 2 sprays into both nostrils daily. 10/12/21  Yes [provider]  mirtazapine (REMERON) 7.5 MG tablet Take 7.5 mg by mouth at bedtime. 04/21/24  Yes [provider]  cetirizine (ZYRTEC) 10 MG tablet Take 10 mg by mouth daily.     [provider]  Cholecalciferol (VITAMIN  D) 1000 UNITS capsule Take 1,000 Units by mouth daily.     [provider]  EPINEPHrine (EPIPEN 2-PAK) 0.3 mg/0.3 mL IJ SOAJ injection Inject 0.3 mg into the muscle once.    [provider]  esomeprazole (NEXIUM) 20 MG capsule Take 20 mg by mouth daily at 12 noon.    [provider]  ibuprofen  (ADVIL ,MOTRIN ) 200 MG tablet Take 600 mg by mouth once. For headache.    [provider]  lamoTRIgine (LAMICTAL) 200 MG tablet Take 200 mg by mouth daily. Anti-seizure medication.  Consult needed.    [provider]  lansoprazole (PREVACID) 30 MG capsule Take 30 mg by mouth daily at 12 noon.    [provider]  LORazepam (ATIVAN) 0.5 MG tablet Take 0.5 mg by mouth as needed for anxiety.    [provider]  magnesium oxide (MAG-OX) 400 MG tablet Take 400 mg by mouth daily.    [provider]  montelukast (SINGULAIR) 10 MG tablet Take 10 mg by mouth daily.    [provider]  Multiple Vitamin (MULTIVITAMIN) tablet Take 1 tablet by mouth daily.    [provider]  Omega-3 Fatty Acids (FISH OIL) 1000 MG CAPS Take 1,000 mg by mouth daily.    [provider]  pramipexole (MIRAPEX) 0.125 MG tablet Take by mouth 3 (three) times daily.    [provider]  Triamcinolone   Acetonide (NASACORT  ALLERGY  24HR NA) Place into the nose.    [provider]  XARELTO  STARTER PACK 15 & 20 MG TBPK Take 15-20 mg by mouth as directed. Take as directed on package: Start with one 15mg  tablet by mouth twice a day with food. On Day 22, switch to one 20mg  tablet once a day with food. 11/06/13   Odell Balls, PA-C    Family History History reviewed. No pertinent family history.  Social History Social History[1]   Allergies   Biaxin [clarithromycin], Codeine, and Percocet [oxycodone-acetaminophen ]   Review of Systems Review of Systems See HPI  Physical Exam Triage Vital Signs ED Triage Vitals  Encounter Vitals  Group     BP 04/22/24 1454 107/74     Girls Systolic BP Percentile --      Girls Diastolic BP Percentile --      Boys Systolic BP Percentile --      Boys Diastolic BP Percentile --      Pulse Rate 04/22/24 1454 78     Resp 04/22/24 1454 20     Temp 04/22/24 1454 98.3 F (36.8 C)     Temp Source 04/22/24 1454 Oral     SpO2 04/22/24 1454 99 %     Weight --      Height --      Head Circumference --      Peak Flow --      Pain Score 04/22/24 1456 4     Pain Loc --      Pain Education --      Exclude from Growth Chart --    No data found.  Updated Vital Signs BP 107/74 (BP Location: Right Arm)   Pulse 78   Temp 98.3 F (36.8 C) (Oral)   Resp 20   SpO2 99%   Visual Acuity Right Eye Distance:   Left Eye Distance:   Bilateral Distance:    Right Eye Near:   Left Eye Near:    Bilateral Near:     Physical Exam Vitals and nursing note reviewed.  Constitutional:      General: She is not in acute distress.    Appearance: Normal appearance. She is not ill-appearing, toxic-appearing or diaphoretic.  HENT:     Head: Normocephalic and atraumatic.     Right Ear: Tympanic membrane and ear canal normal.     Left Ear: Tympanic membrane and ear canal normal.     Nose: Congestion present.     Mouth/Throat:     Pharynx: Oropharynx is clear.  Eyes:     Conjunctiva/sclera: Conjunctivae normal.  Cardiovascular:     Rate and Rhythm: Normal rate and regular rhythm.     Pulses: Normal pulses.     Heart sounds: Normal heart sounds.  Pulmonary:     Effort: Pulmonary effort is normal.     Breath sounds: Normal breath sounds.  Skin:    General: Skin is warm and dry.  Neurological:     Mental Status: She is alert.  Psychiatric:        Mood and Affect: Mood normal.      UC Treatments / Results  Labs (all labs ordered are listed, but only abnormal results are displayed) Labs Reviewed - No data to display  EKG   Radiology No results found.  Procedures Procedures (including  critical care time)  Medications Ordered in UC Medications  triamcinolone  acetonide (KENALOG -40) injection 40 mg (40 mg Intramuscular Given 04/22/24 1522)    Initial Impression /  Assessment and Plan / UC Course  I have reviewed the triage vital signs and the nursing notes.  Pertinent labs & imaging results that were available during my care of the patient were reviewed by me and considered in my medical decision making (see chart for details).     Chronic sinusitis-patient with past medical history of chronic recurrent sinusitis.  History of sinus surgery.  Was treated for sinus infection back at Christmas time and feels like it made her fluid went away.  She is having recurrence of sinus pressure, congestion, headache, ear pain. Kenalog  injection given here today for her ETD and sinusitis Cefdinir  as prescribed Recommend follow-up with ENT for any other further problems Final Clinical Impressions(s) / UC Diagnoses   Final diagnoses:  Chronic sinusitis, unspecified location     Discharge Instructions      Treating you for a possible sinus infection.  Take the medications as prescribed.  Continue with over-the-counter measures as needed.  Follow-up as needed     ED Prescriptions     Medication Sig Dispense Auth. Provider   cefdinir  (OMNICEF ) 300 MG capsule Take 1 capsule (300 mg total) by mouth 2 (two) times daily for 7 days. 14 capsule Adah Corning A, FNP      PDMP not reviewed this encounter.     [1]  Social History Tobacco Use   Smoking status: Never   Smokeless tobacco: Never  Substance Use Topics   Alcohol use: No   Drug use: No     Adah Corning LABOR, FNP 04/22/24 1525  "

## 2024-04-22 NOTE — ED Triage Notes (Signed)
 States onset of ear pressure yesterday afternoon. That has improved. Nasal congestion today. Suffers from chronic sinusitis.  Treated with antibiotic over Christmas. Patient bought a covid/flu test at our pharmacy and has not taken it. Denies fever.
